# Patient Record
Sex: Male | Born: 1990 | Race: White | Hispanic: No | Marital: Single
Health system: Southern US, Community
[De-identification: ages and names within clinical notes are randomized; demographics above are authoritative.]

---
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
## 2010-01-12 IMAGING — CT CT WRIST*L* W/O CM
1 of 2 series · 9 of 14 positions shown, 12 images · non-contrast
Comparison: No comparison

REASON FOR EXAM: s/p L wrist fx    left wrist pain   cast
COMMENTS:

PROCEDURE:     CT  - CT WRIST LEFT WITHOUT CONTRAST  - January 23, 2009 [DATE]
RESULT:     Study: CT left wrist
HISTORY: Left wrist fracture
TECHNIQUE: Multiple axial images obtained of the left wrist with sagittal
and coronal reformatted images provided.

[Series 7: thins · axial · 0.74mm/px · z∈[+726,+830]mm · 9 of 133 slices shown, 12 images]
[im 14/133  soft-tissue]
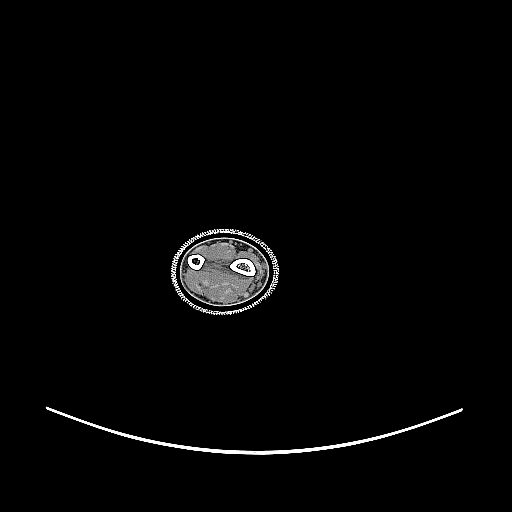
[im 14/133  bone]
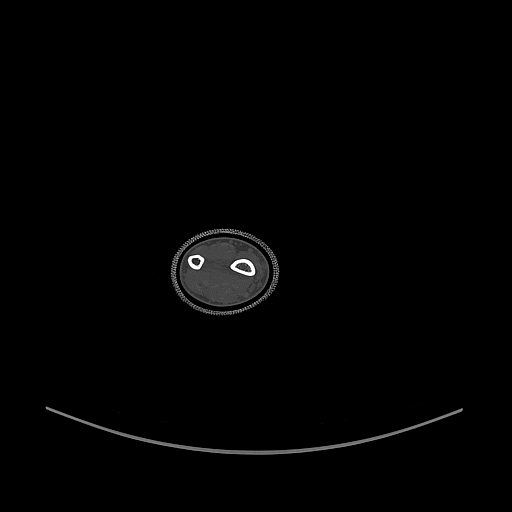
[im 27/133  bone]
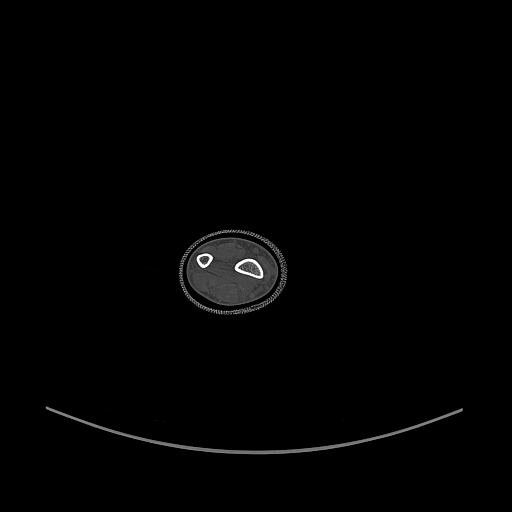
[im 40/133  bone]
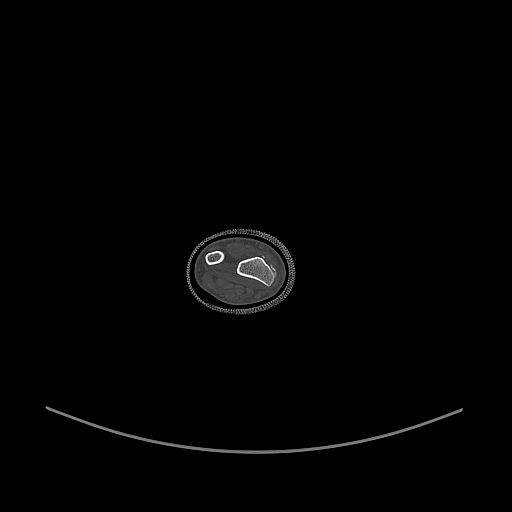
[im 53/133  bone]
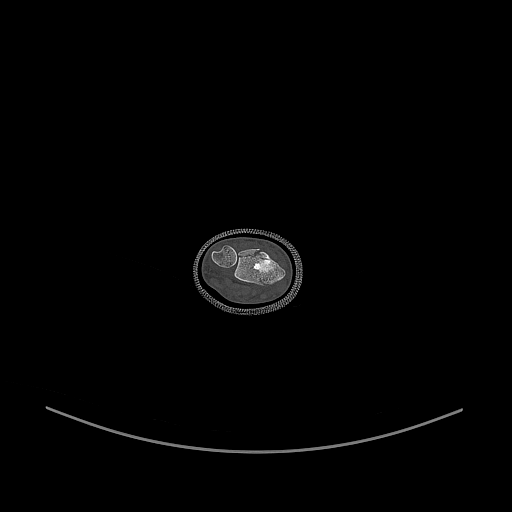
[im 67/133  soft-tissue]
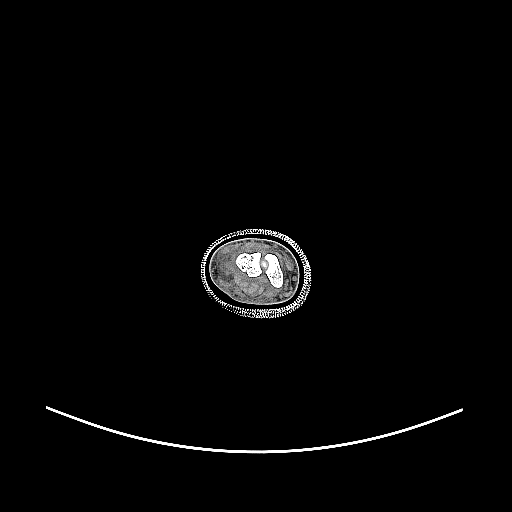
[im 67/133  bone]
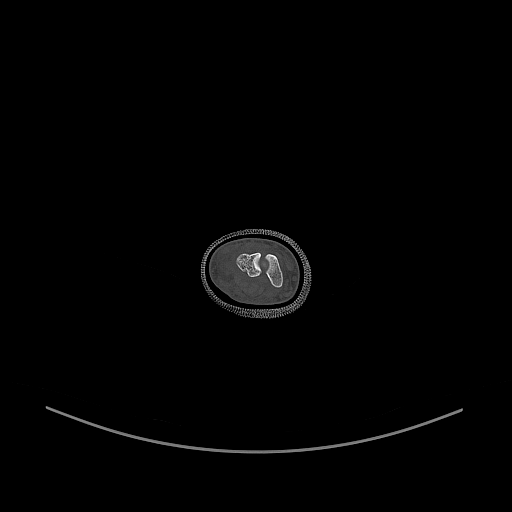
[im 80/133  bone]
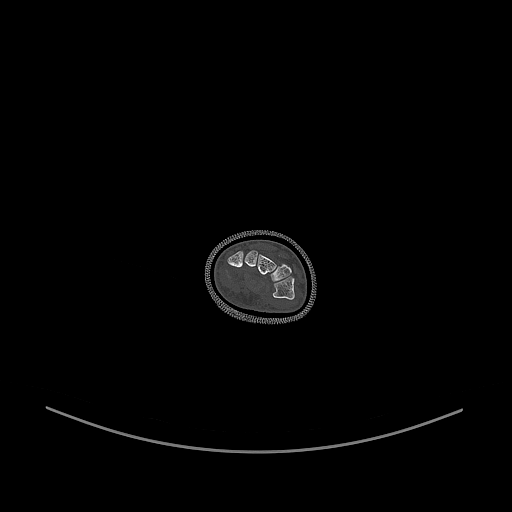
[im 93/133  bone]
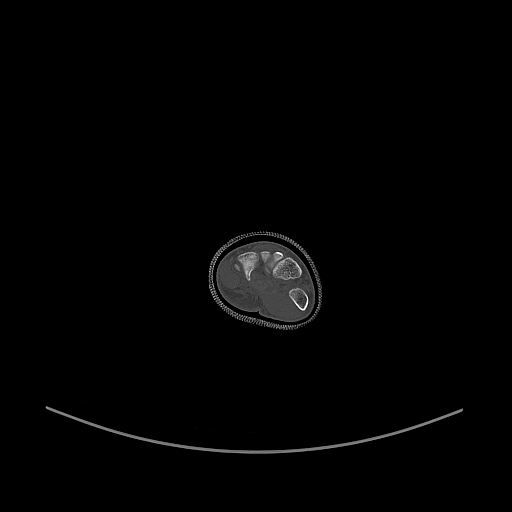
[im 106/133  bone]
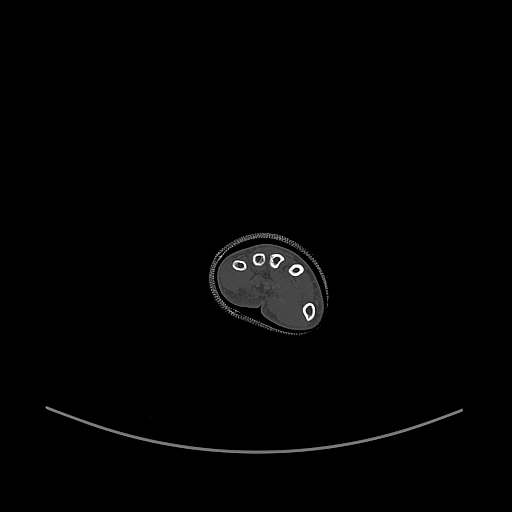
[im 119/133  soft-tissue]
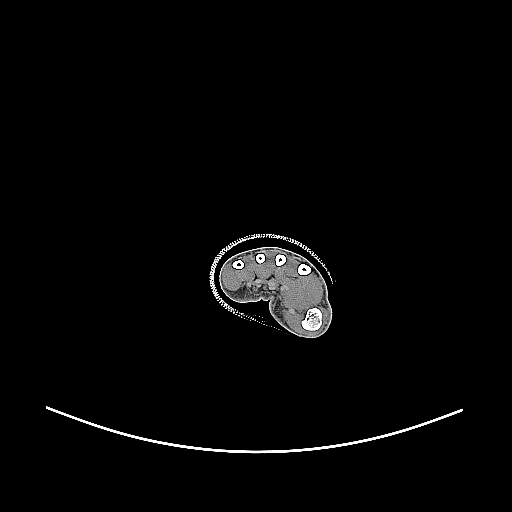
[im 119/133  bone]
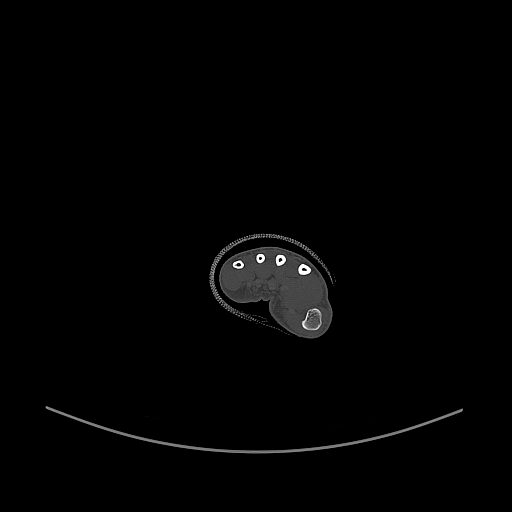

[9 of 14 positions shown; findings below may reference images not displayed]

FINDINGS: There is a comminuted fracture of the distal radial metaphysis. There is a
fracture cleft which extends to the articular surface of the radiocarpal
joint. There is a fracture cleft which extends to the distal radial ulnar
joint. There is no significant depression. The dorsal surface demonstrates
the most comminution with mild impaction. The radiocarpal alignment is
anatomic. There is a minimally displaced ulnar styloid fracture.

There is no evidence of a carpal bone fracture.

The soft tissues are grossly unremarkable.
IMPRESSION: Comminuted fracture of the distal left radial metaphysis extending to the
radiocarpal joint and distal radial ulnar joint.

There is a minimally displaced fracture of the ulnar styloid process.

## 2013-11-20 ENCOUNTER — Ambulatory Visit: Payer: Self-pay

## 2013-11-20 LAB — RAPID STREP-A WITH REFLX: MICRO TEXT REPORT: NEGATIVE

## 2013-11-21 LAB — BETA STREP CULTURE(ARMC)

## 2021-01-02 ENCOUNTER — Emergency Department
Admission: EM | Admit: 2021-01-02 | Discharge: 2021-01-02 | Disposition: A | Discharge status: Home / Self Care | Payer: Self-pay | Attending: Emergency Medicine | Admitting: Emergency Medicine

## 2021-01-02 ENCOUNTER — Other Ambulatory Visit: Payer: Self-pay

## 2021-01-02 DIAGNOSIS — X58XXXA Exposure to other specified factors, initial encounter: Secondary | ICD-10-CM | POA: Insufficient documentation

## 2021-01-02 DIAGNOSIS — R202 Paresthesia of skin: Secondary | ICD-10-CM | POA: Insufficient documentation

## 2021-01-02 DIAGNOSIS — T679XXA Effect of heat and light, unspecified, initial encounter: Secondary | ICD-10-CM

## 2021-01-02 DIAGNOSIS — T675XXA Heat exhaustion, unspecified, initial encounter: Secondary | ICD-10-CM | POA: Insufficient documentation

## 2021-01-02 DIAGNOSIS — R519 Headache, unspecified: Secondary | ICD-10-CM | POA: Insufficient documentation

## 2021-01-02 DIAGNOSIS — R110 Nausea: Secondary | ICD-10-CM | POA: Insufficient documentation

## 2021-01-02 DIAGNOSIS — R531 Weakness: Secondary | ICD-10-CM | POA: Insufficient documentation

## 2021-01-02 LAB — BASIC METABOLIC PANEL
Anion gap: 7 (ref 5–15)
BUN: 12 mg/dL (ref 6–20)
CO2: 26 mmol/L (ref 22–32)
Calcium: 8.9 mg/dL (ref 8.9–10.3)
Chloride: 103 mmol/L (ref 98–111)
Creatinine, Ser: 0.8 mg/dL (ref 0.61–1.24)
GFR, Estimated: >60 mL/min (ref >60)
Glucose, Bld: 106 mg/dL — ABNORMAL HIGH (ref 70–99)
Potassium: 3.6 mmol/L (ref 3.5–5.1)
Sodium: 136 mmol/L (ref 135–145)

## 2021-01-02 LAB — CBC
HCT: 40.5 % (ref 39.0–52.0)
Hemoglobin: 14.5 g/dL (ref 13.0–17.0)
MCH: 29.6 pg (ref 26.0–34.0)
MCHC: 35.8 g/dL (ref 30.0–36.0)
MCV: 82.7 fL (ref 80.0–100.0)
Platelets: 209 10*3/uL (ref 150–400)
RBC: 4.9 MIL/uL (ref 4.22–5.81)
RDW: 12.2 % (ref 11.5–15.5)
WBC: 7.6 10*3/uL (ref 4.0–10.5)
nRBC: 0 % (ref 0.0–0.2)

## 2021-01-02 LAB — CK: Total CK: 98 U/L (ref 49–397)

## 2021-01-02 MED ORDER — LACTATED RINGERS IV BOLUS
1000.0000 mL | Freq: Once | INTRAVENOUS | Status: AC
  Administered 2021-01-02: 1000 mL via INTRAVENOUS

## 2021-01-02 MED ORDER — ACETAMINOPHEN 500 MG PO TABS
1000.0000 mg | ORAL_TABLET | Freq: Once | ORAL | Status: AC
  Administered 2021-01-02: 1000 mg via ORAL
  Filled 2021-01-02: qty 2

## 2021-01-02 MED ORDER — KETOROLAC TROMETHAMINE 30 MG/ML IJ SOLN
15.0000 mg | Freq: Once | INTRAMUSCULAR | Status: AC
  Administered 2021-01-02: 15 mg via INTRAVENOUS
  Filled 2021-01-02: qty 1

## 2021-01-02 NOTE — ED Notes (Signed)
[place] provided to pt

## 2021-01-02 NOTE — ED Provider Notes (Signed)
[facility] Emergency Department Provider Note ____________________________________________   Event Date/Time   First MD Initiated Contact with Patient 01/02/21 1443     (approximate)  I have reviewed the triage vital signs and the nursing notes.  HISTORY  Chief Complaint Heat Exposure   HPI Jason Wilkins is a 30 y.o. malewho presents to the ED for evaluation of headache and heat exposure  Chart review indicates no relevant history. Pt self-reports a history of migrainous headaches, for which he takes OTC medications as needed.  Who presents to the ED from his workplace for evaluation of heat exposure and headache.  He reports feeling normal yesterday.  Waking up this morning with a typical migrainous headache for which he did not take any medications.  Reports not eating breakfast.  He reports working outside all day in the heat, developing nausea with his headache, generalized weakness and tingling to his bilateral hands and feet.  Denies any dizziness or syncope, chest pain, shortness of breath, falls or injuries.  History reviewed. No pertinent past medical history.  There are no problems to display for this patient.   History reviewed. No pertinent surgical history.  Prior to Admission medications   Not on File    Allergies Patient has no known allergies.  No family history on file.  Social History Social History   Substance Use Topics   Alcohol use: Yes   Drug use: Not Currently    Review of Systems  Constitutional: No fever/chills Eyes: No visual changes. ENT: No sore throat. Cardiovascular: Denies chest pain. Respiratory: Denies shortness of breath. Gastrointestinal: No abdominal pain.  No nausea, no vomiting.  No diarrhea.  No constipation. Genitourinary: Negative for dysuria. Musculoskeletal: Negative for back pain.  Muscle cramps and myalgias. Skin: Negative for rash. Neurological: Negative for focal weakness or numbness.   Positive for typical migrainous headache  ____________________________________________   PHYSICAL EXAM:  VITAL SIGNS: Vitals:   01/02/21 1319  BP: 124/86  Pulse: 68  Resp: 16  Temp: 97.9 F (36.6 C)  SpO2: 97%     Constitutional: Alert and oriented. Well appearing and in no acute distress. Eyes: Conjunctivae are normal. PERRL. EOMI. Head: Atraumatic. Nose: No congestion/rhinnorhea. Mouth/Throat: Mucous membranes are moist.  Oropharynx non-erythematous. Neck: No stridor. No cervical spine tenderness to palpation. Cardiovascular: Normal rate, regular rhythm. Grossly normal heart sounds.  Good peripheral circulation. Respiratory: Normal respiratory effort.  No retractions. Lungs CTAB. Gastrointestinal: Soft , nondistended, nontender to palpation. No CVA tenderness. Musculoskeletal: No lower extremity tenderness nor edema.  No joint effusions. No signs of acute trauma. Neurologic:  Normal speech and language. No gross focal neurologic deficits are appreciated. No gait instability noted. Cranial nerves II through XII intact 5/5 strength and sensation in all 4 extremities Skin:  Skin is warm, dry and intact. No rash noted. Psychiatric: Mood and affect are normal. Speech and behavior are normal.  ____________________________________________   LABS (all labs ordered are listed, but only abnormal results are displayed)  Labs Reviewed  BASIC METABOLIC PANEL - Abnormal; Notable for the following components:      Result Value   Glucose, Bld 106 (*)    All other components within normal limits  CBC  CK   ____________________________________________  12 Lead EKG   ____________________________________________   PROCEDURES and INTERVENTIONS  Procedure(s) performed (including Critical Care):  Procedures  Medications  lactated ringers bolus 1,000 mL (1,000 mLs Intravenous New Bag/Given 01/02/21 1501)  ketorolac (TORADOL) 30 MG/ML injection 15 mg (  15 mg Intravenous Given  01/02/21 1458)  acetaminophen (TYLENOL) tablet 1,000 mg (1,000 mg Oral Given 01/02/21 1458)    ____________________________________________   MDM / ED COURSE   30 year old male with history of migrainous headaches, but otherwise healthy, presents to the ED from work with heat exposure and a bad headache, ultimately amenable to outpatient management.  Normal vitals.  Exam reassuring without evidence of neurologic or vascular deficits, distress or signs of trauma.  He looks well.  Blood work is similarly reassuring without electrolyte derangements, renal dysfunction or evidence of rhabdomyolysis.  No indications for central nervous imaging.  Resolving symptoms with IV fluids, Tylenol and Toradol.  We discussed staying hydrated at home, discussed management of his headaches at home, and we discussed return precautions for the ED.  Clinical Course as of 01/02/21 1611  Thu Jan 02, 2021  1554 Reassessed.  Patient with resolution of his headache.  We discussed benign work-up and reassuring CK value. [DS]    Clinical Course User Index [DS] Delton Prairie, MD    ____________________________________________   FINAL CLINICAL IMPRESSION(S) / ED DIAGNOSES  Final diagnoses:  Bad headache  Heat exposure, initial encounter     ED Discharge Orders     None        [name] Katrinka Blazing   Note:  This document was prepared using Dragon voice recognition software and may include unintentional dictation errors.    Delton Prairie, MD 01/02/21 [id number]

## 2021-01-02 NOTE — Discharge Instructions (Addendum)
Use Tylenol for pain and fevers.  Up to 1000 mg per dose, up to 4 times per day.  Do not take more than 4000 mg of Tylenol/acetaminophen within 24 hours..  Use naproxen/Aleve for anti-inflammatory pain relief. Use up to 500mg  every 12 hours. Do not take more frequently than this. Do not use other NSAIDs (ibuprofen, Advil) while taking this medication. It is safe to take Tylenol with this.   Try diluting some Gatorade to get some electrolytes with your fluids at work.

## 2021-01-02 NOTE — ED Triage Notes (Signed)
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
